# Patient Record
Sex: Male | Born: 1975 | Race: White | Hispanic: Yes | Marital: Married | Smoking: Current every day smoker
Health system: Southern US, Community
[De-identification: ages and names within clinical notes are randomized; demographics above are authoritative.]

---

## 2020-03-19 ENCOUNTER — Emergency Department (HOSPITAL_BASED_OUTPATIENT_CLINIC_OR_DEPARTMENT_OTHER): Payer: Self-pay

## 2020-03-19 ENCOUNTER — Encounter (HOSPITAL_BASED_OUTPATIENT_CLINIC_OR_DEPARTMENT_OTHER): Payer: Self-pay

## 2020-03-19 ENCOUNTER — Other Ambulatory Visit: Payer: Self-pay

## 2020-03-19 ENCOUNTER — Emergency Department (HOSPITAL_BASED_OUTPATIENT_CLINIC_OR_DEPARTMENT_OTHER)
Admission: EM | Admit: 2020-03-19 | Discharge: 2020-03-19 | Disposition: A | Discharge status: Home / Self Care | Payer: Self-pay | Attending: Emergency Medicine | Admitting: Emergency Medicine

## 2020-03-19 DIAGNOSIS — F17210 Nicotine dependence, cigarettes, uncomplicated: Secondary | ICD-10-CM | POA: Insufficient documentation

## 2020-03-19 DIAGNOSIS — W11XXXA Fall on and from ladder, initial encounter: Secondary | ICD-10-CM | POA: Insufficient documentation

## 2020-03-19 DIAGNOSIS — Y999 Unspecified external cause status: Secondary | ICD-10-CM | POA: Insufficient documentation

## 2020-03-19 DIAGNOSIS — Y929 Unspecified place or not applicable: Secondary | ICD-10-CM | POA: Insufficient documentation

## 2020-03-19 DIAGNOSIS — S82014A Nondisplaced osteochondral fracture of right patella, initial encounter for closed fracture: Secondary | ICD-10-CM | POA: Insufficient documentation

## 2020-03-19 DIAGNOSIS — S82001A Unspecified fracture of right patella, initial encounter for closed fracture: Secondary | ICD-10-CM

## 2020-03-19 DIAGNOSIS — Y939 Activity, unspecified: Secondary | ICD-10-CM | POA: Insufficient documentation

## 2020-03-19 DIAGNOSIS — W19XXXA Unspecified fall, initial encounter: Secondary | ICD-10-CM

## 2020-03-19 MED ORDER — OXYCODONE HCL 5 MG PO TABS: 5.0000 mg | ORAL_TABLET | ORAL | 0 refills | Status: AC | PRN

## 2020-03-19 MED ORDER — ACETAMINOPHEN 500 MG PO TABS
1000.0000 mg | ORAL_TABLET | Freq: Once | ORAL | Status: AC
  Administered 2020-03-19: 1000 mg via ORAL
  Filled 2020-03-19: qty 2

## 2020-03-19 MED ORDER — OXYCODONE HCL 5 MG PO TABS
5.0000 mg | ORAL_TABLET | Freq: Once | ORAL | Status: AC
  Administered 2020-03-19: 5 mg via ORAL
  Filled 2020-03-19: qty 1

## 2020-03-19 NOTE — ED Triage Notes (Signed)
Pt arrives with reports of fall from ladder, pain and swelling to both knees. Pt had surgery in 2018 on right knee, worse pain is on right knee. Pt reports the ladder was not sturdy and his knees hit the ladder ruts coming down.

## 2020-03-19 NOTE — Discharge Instructions (Signed)
Take Tylenol (acetaminophen) 1000 mg 4 times a day for 1 week. This is the maximum dose of Tylenol usually take from all sources. Please check other over-the-counter medications and prescriptions to ensure you are not taking other medications that contain acetaminophen.  You may also take ibuprofen 400 mg 6 times a day alternating with or at the same time as tylenol.  Take oxycodone as needed for breakthrough pain.  This medication can be addicting, sedating and cause constipation.

## 2020-03-19 NOTE — ED Provider Notes (Signed)
[facility] EMERGENCY DEPARTMENT Provider Note   CSN: [id number] Arrival date & time: 03/19/20  1022     History Chief Complaint  Patient presents with  . Fall    Omar Shields is a 44 y.o. male.  HPI      44yo male presents with concern for fall from ladder with knee pain.  Yesterday was on 66ft ladder and fell off, hitting knees on the rungs as he fell. No head trauma, headache, LOC, neck pain, numbness/weakness, chest, abdomen or pelvis pain. Reports mild right hand pain and right ankle pain in addition to bilateral knee pain left greater than right.  Last night was able to straighten right knee but unable to do that today. Reports UTD tetanus vax.  History reviewed. No pertinent past medical history.  There are no problems to display for this patient.   History reviewed. No pertinent surgical history.     No family history on file.  Social History   Tobacco Use  . Smoking status: Current Every Day Smoker    Packs/day: 1.00    Types: Cigarettes  . Smokeless tobacco: Never Used  Vaping Use  . Vaping Use: Never used  Substance Use Topics  . Alcohol use: Yes    Comment: occ  . Drug use: Never    Home Medications Prior to Admission medications   Medication Sig Start Date End Date Taking? Authorizing Provider  oxyCODONE (ROXICODONE) 5 MG immediate release tablet Take 1 tablet (5 mg total) by mouth every 4 (four) hours as needed for severe pain. 03/19/20   Alvira Monday, MD    Allergies    Patient has no known allergies.  Review of Systems   Review of Systems  Physical Exam Updated Vital Signs BP 125/72   Pulse 80   Temp 98.9 F (37.2 C)   Resp 18   Ht 5\' 4"  (1.626 m)   Wt 66.7 kg   SpO2 100%   BMI 25.23 kg/m   Physical Exam Vitals and nursing note reviewed.  Constitutional:      General: He is not in acute distress.    Appearance: He is well-developed. He is not diaphoretic.  HENT:     Head: Normocephalic and atraumatic.    Eyes:     Conjunctiva/sclera: Conjunctivae normal.  Cardiovascular:     Rate and Rhythm: Normal rate and regular rhythm.     Heart sounds: Normal heart sounds. No murmur heard.  No friction rub. No gallop.   Pulmonary:     Effort: Pulmonary effort is normal. No respiratory distress.     Breath sounds: Normal breath sounds. No wheezing or rales.  Abdominal:     General: There is no distension.     Palpations: Abdomen is soft.     Tenderness: There is no abdominal tenderness. There is no guarding.  Musculoskeletal:        General: Swelling and tenderness present.     Cervical back: Normal range of motion.     Comments: Bilateral knee swelling and tenderness, contusion left knee and abrasion Right knee with more signficant tenderness, difficulty extending knee   No C/T/L tenderness/pelvic tenderness Mild right 2nd MCP tenderness, mild ankle tenderness  Skin:    General: Skin is warm and dry.  Neurological:     Mental Status: He is alert and oriented to person, place, and time.     ED Results / Procedures / Treatments   Labs (all labs ordered are listed, but only abnormal results  are displayed) Labs Reviewed - No data to display  EKG None  Radiology DG Ankle Complete Right  Result Date: 03/19/2020 CLINICAL DATA:  Ankle pain since falling from a ladder. EXAM: RIGHT ANKLE - COMPLETE 3+ VIEW COMPARISON:  None. FINDINGS: The mineralization and alignment are normal. There is no evidence of acute fracture or dislocation. The joint spaces are preserved. No focal soft tissue swelling or foreign body. IMPRESSION: No evidence of acute right ankle injury. Electronically Signed   By: Carey Bullocks M.D.   On: 03/19/2020 15:59   DG Knee Complete 4 Views Left  Result Date: 03/19/2020 CLINICAL DATA:  Status post fall.  Knee pain. EXAM: LEFT KNEE - COMPLETE 4+ VIEW COMPARISON:  None. FINDINGS: No evidence of fracture, dislocation, or joint effusion. No evidence of arthropathy or other focal  bone abnormality. Soft tissues are unremarkable. IMPRESSION: Negative. Electronically Signed   By: Ted Mcalpine M.D.   On: 03/19/2020 11:57   DG Knee Complete 4 Views Right  Result Date: 03/19/2020 CLINICAL DATA:  Fall with swelling. EXAM: RIGHT KNEE - COMPLETE 4+ VIEW COMPARISON:  None. FINDINGS: Large suprapatellar joint effusion. Fracture of the patella, primarily identified superiorly and laterally. Not well delineated on the lateral view. IMPRESSION: Patella fracture or fractures with large suprapatellar joint effusion. Consider further evaluation with CT, to delineate the extent of fracture/fractures. Electronically Signed   By: Jeronimo Greaves M.D.   On: 03/19/2020 11:56   DG Hand Complete Right  Result Date: 03/19/2020 CLINICAL DATA:  Fall from a ladder.  Right hand soreness. EXAM: RIGHT HAND - COMPLETE 3+ VIEW COMPARISON:  None. FINDINGS: The mineralization and alignment are normal. There is no evidence of acute fracture or dislocation. The joint spaces are preserved. Mild soft tissue thickening is present around the 2nd and 3rd proximal interphalangeal joints. No apparent swelling, foreign body or erosive changes. IMPRESSION: No acute osseous findings. Electronically Signed   By: Carey Bullocks M.D.   On: 03/19/2020 15:57    Procedures Procedures (including critical care time)  Medications Ordered in ED Medications  oxyCODONE (Oxy IR/ROXICODONE) immediate release tablet 5 mg (5 mg Oral Given 03/19/20 1544)  acetaminophen (TYLENOL) tablet 1,000 mg (1,000 mg Oral Given 03/19/20 1544)    ED Course  I have reviewed the triage vital signs and the nursing notes.  Pertinent labs & imaging results that were available during my care of the patient were reviewed by me and considered in my medical decision making (see chart for details).    MDM Rules/Calculators/A&P                          44yo male presents with concern for fall from ladder yesterday with knee pain.  Low suspicion for  intracranial, spinal, thoracic/abdominal/pelvic injuries by history and exam.  NV intact.  XR right knee with patella fracture (or fractures-difficult to visualize)--unable to fully extend at knee.  Left knee without abnormalities on XR.  Discussed with Dr. Aundria Rud. Recommend WBAT with knee immobilizer and follow up in one week. Reviewed in [place] drug database and discussed risks of narcotics. Patient discharged in stable condition with understanding of reasons to return.       Final Clinical Impression(s) / ED Diagnoses Final diagnoses:  Fall, initial encounter  Closed nondisplaced fracture of right patella, unspecified fracture morphology, initial encounter    Rx / DC Orders ED Discharge Orders         Ordered  oxyCODONE (ROXICODONE) 5 MG immediate release tablet  Every 4 hours PRN        03/19/20 1552           Alvira Monday, MD 03/19/20 2311

## 2021-01-09 IMAGING — DX DG ANKLE COMPLETE 3+V*R*
3 series · 3 of 3 positions shown · non-contrast
Comparison: None.

CLINICAL DATA: Ankle pain since falling from a ladder.

EXAM:
RIGHT ANKLE - COMPLETE 3+ VIEW

[ankle ap]
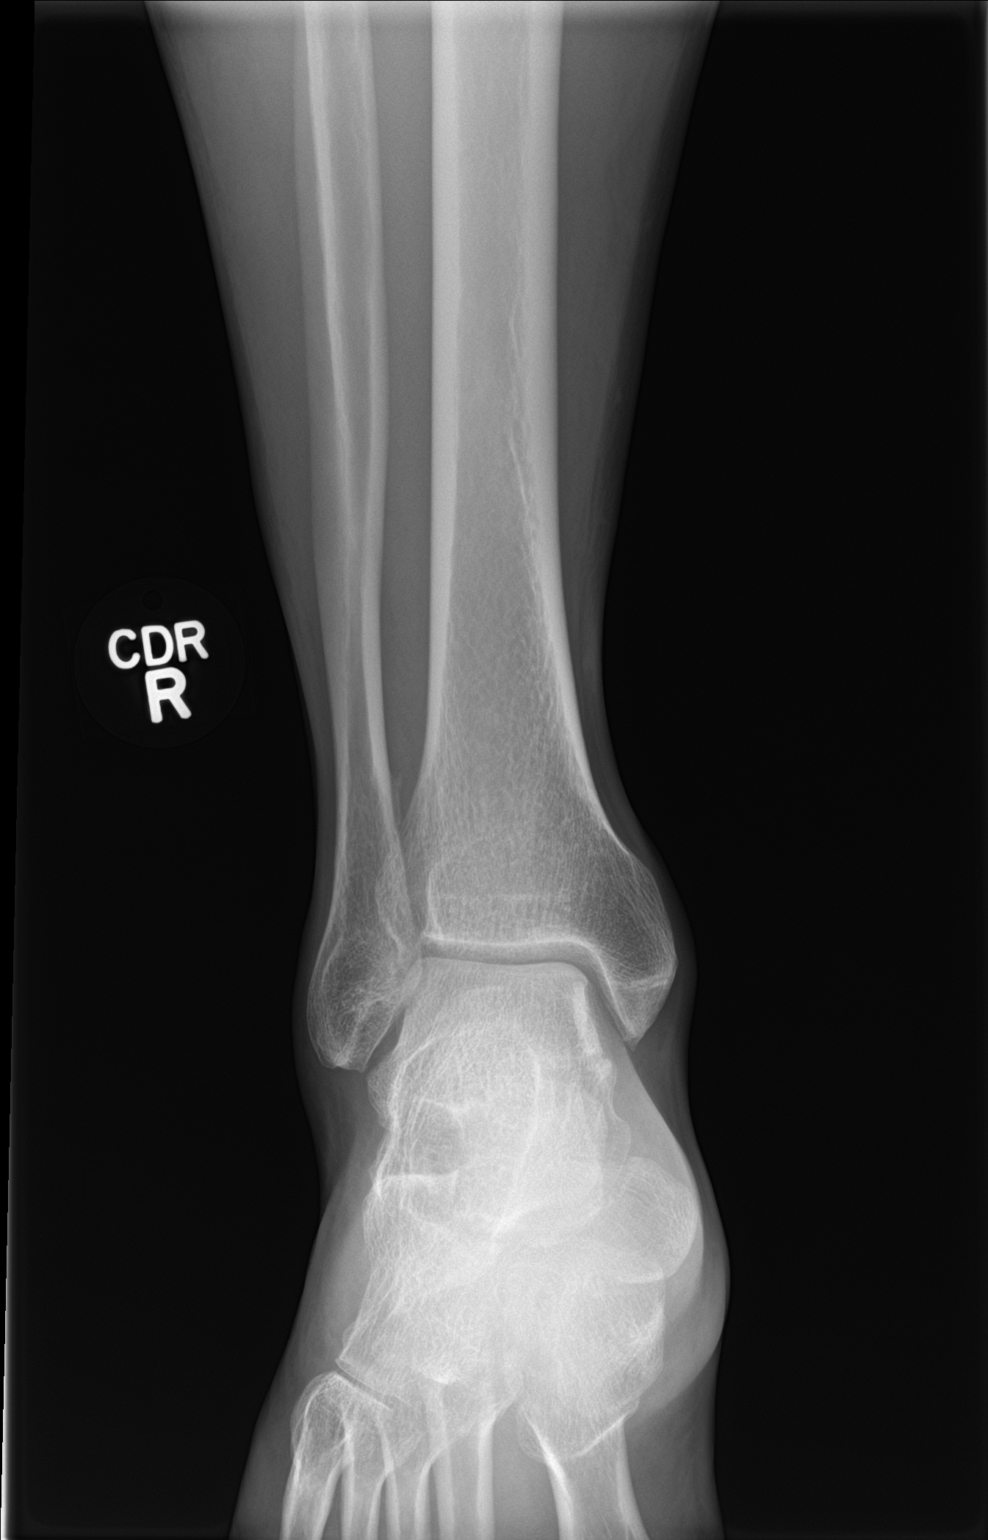

[ankle obl]
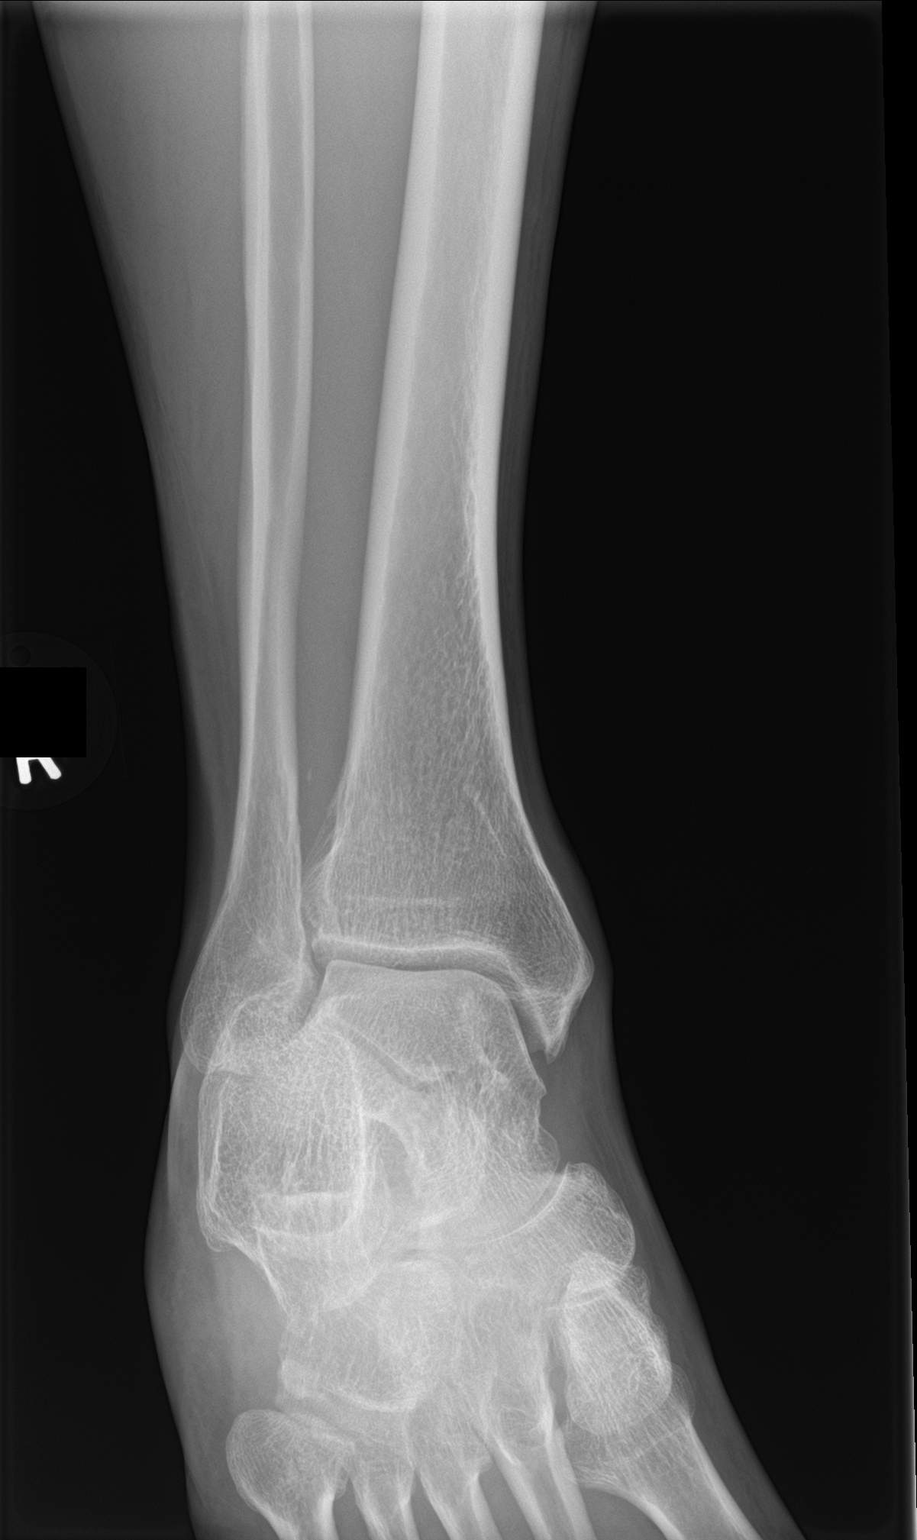

[ankle lat]
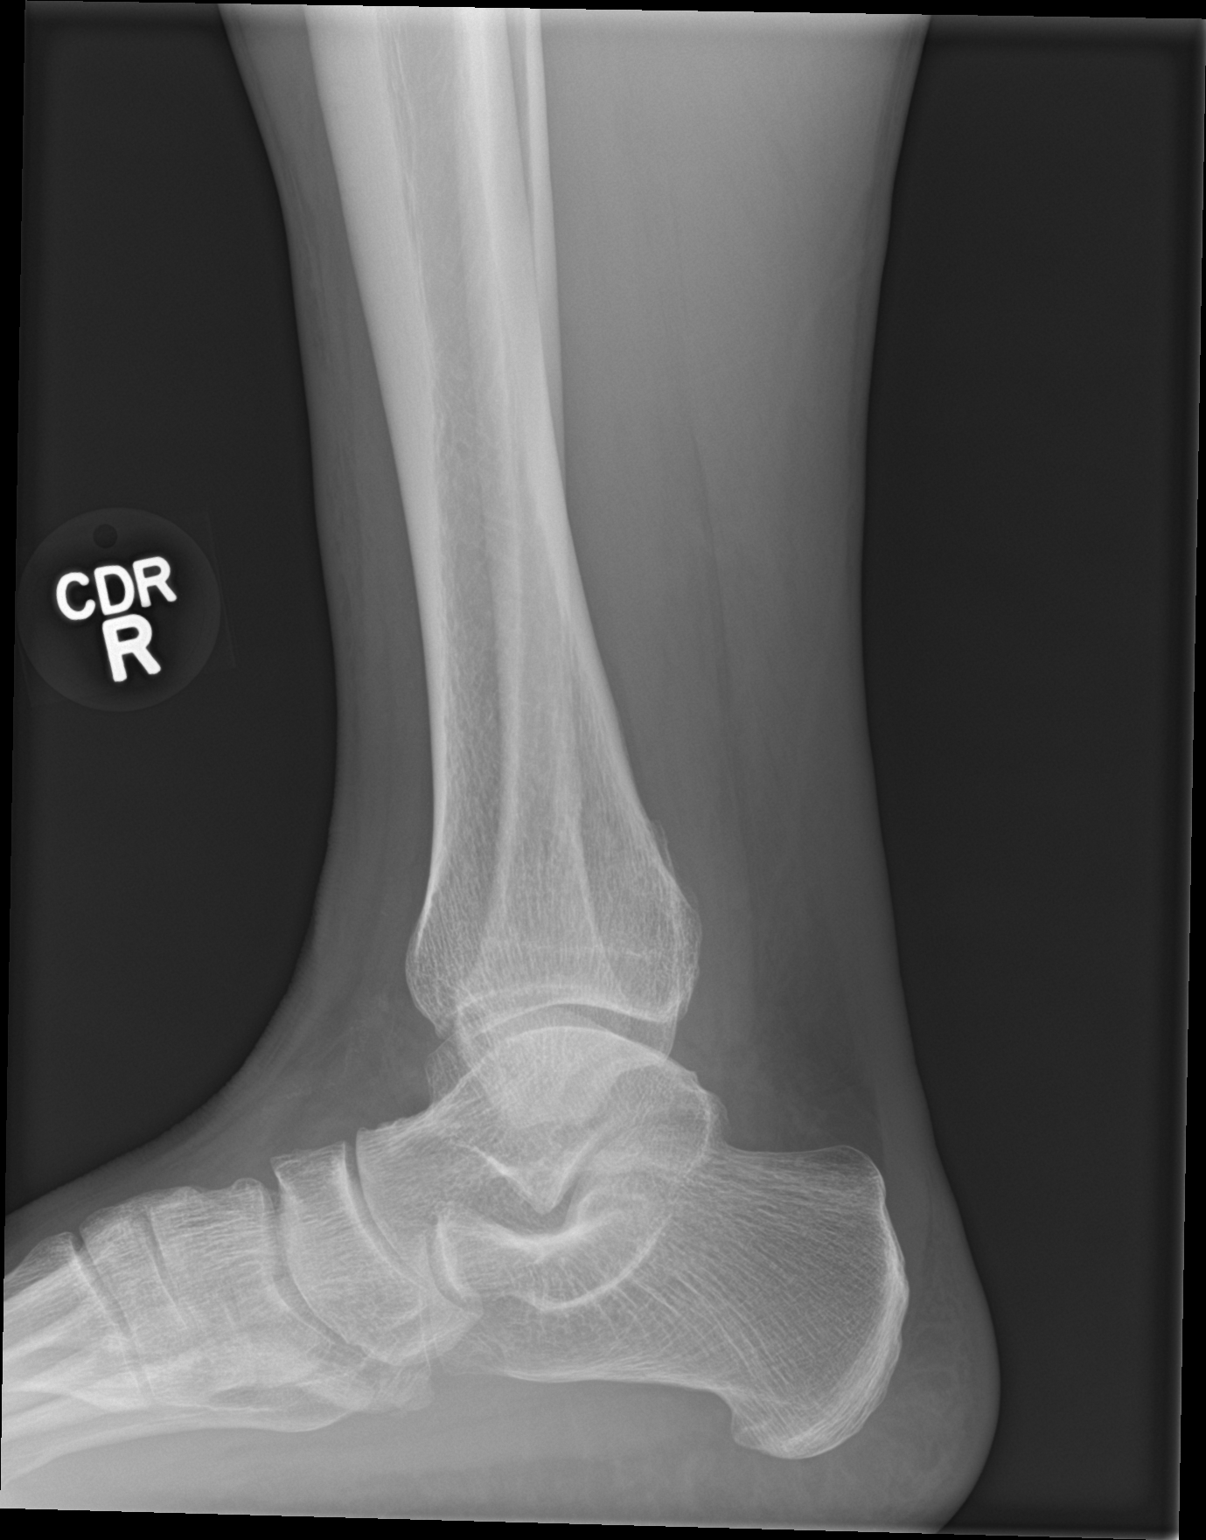

[3 of 3 positions shown; findings below may reference images not displayed]

FINDINGS: The mineralization and alignment are normal. There is no evidence of
acute fracture or dislocation. The joint spaces are preserved. No
focal soft tissue swelling or foreign body.
IMPRESSION: No evidence of acute right ankle injury.

## 2021-01-09 IMAGING — CR DG KNEE COMPLETE 4+V*L*
4 series · 4 of 4 positions shown · non-contrast
Comparison: None.

CLINICAL DATA: Status post fall.  Knee pain.

EXAM:
LEFT KNEE - COMPLETE 4+ VIEW

[t knee ap left]
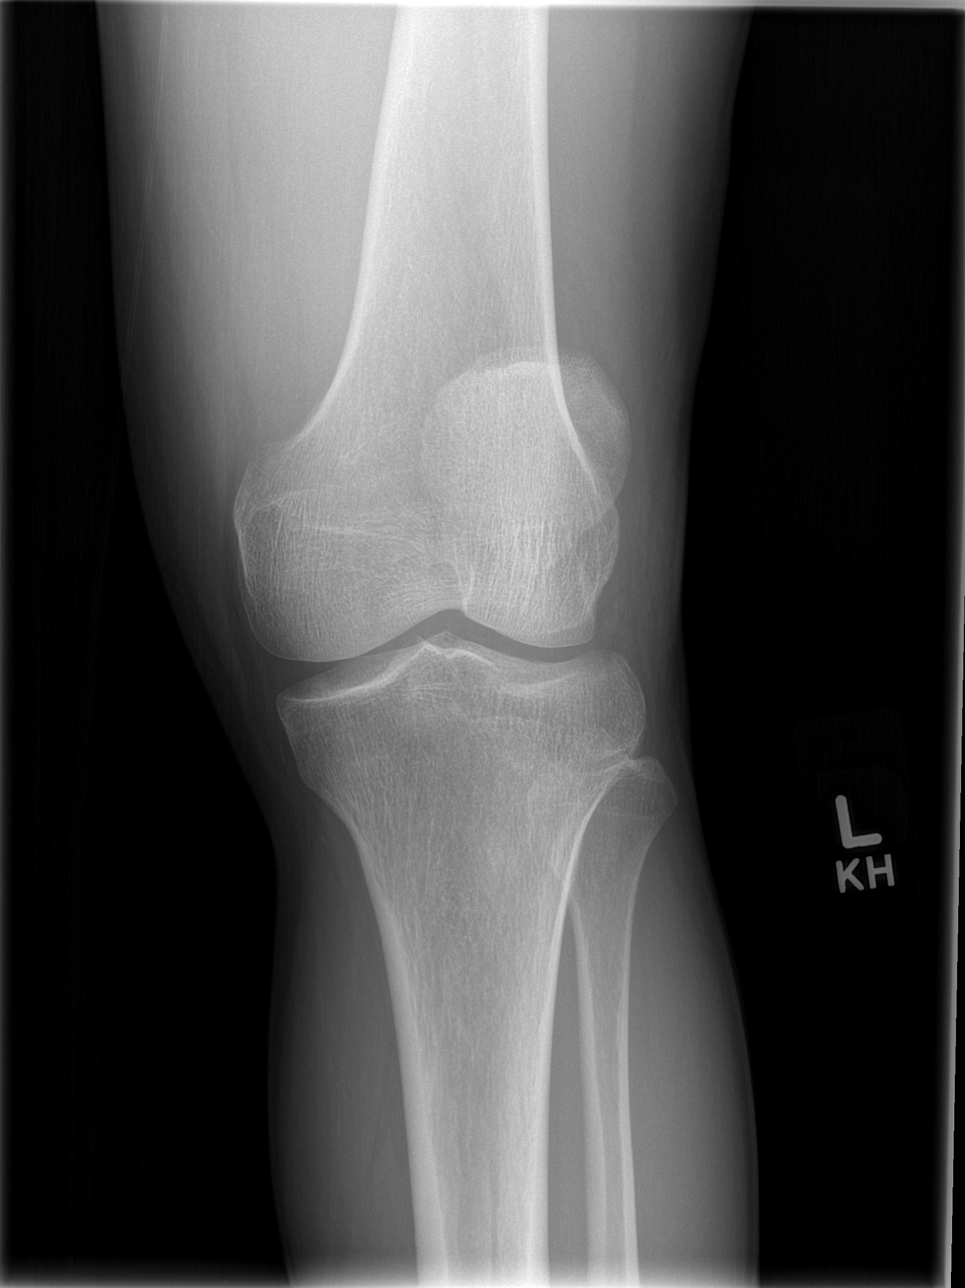

[t knee oblique left (1 of 2)]
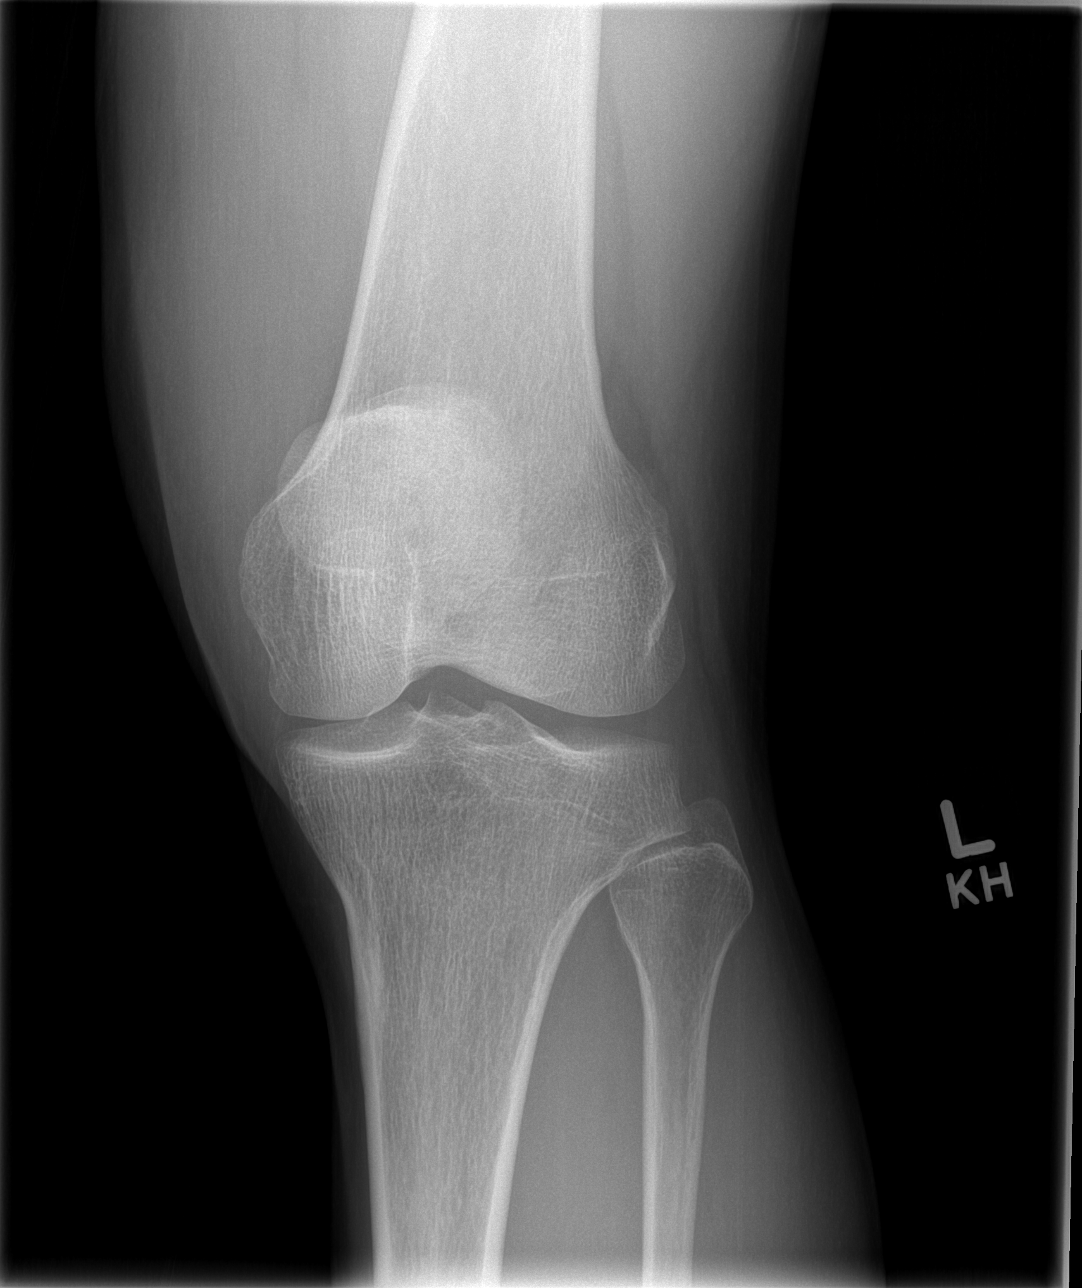

[t knee oblique left (2 of 2)]
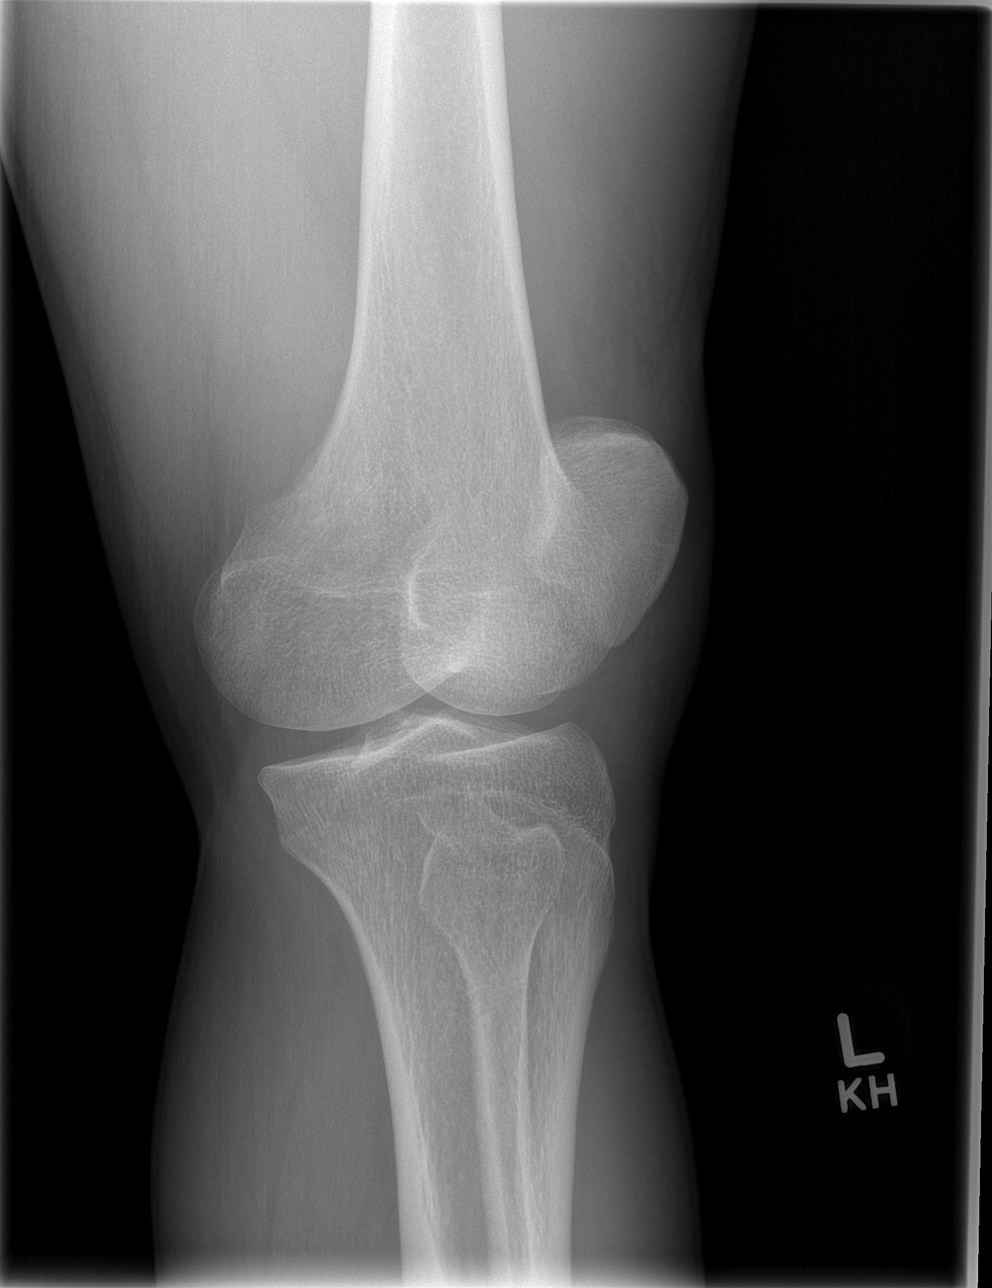

[t knee lat left]
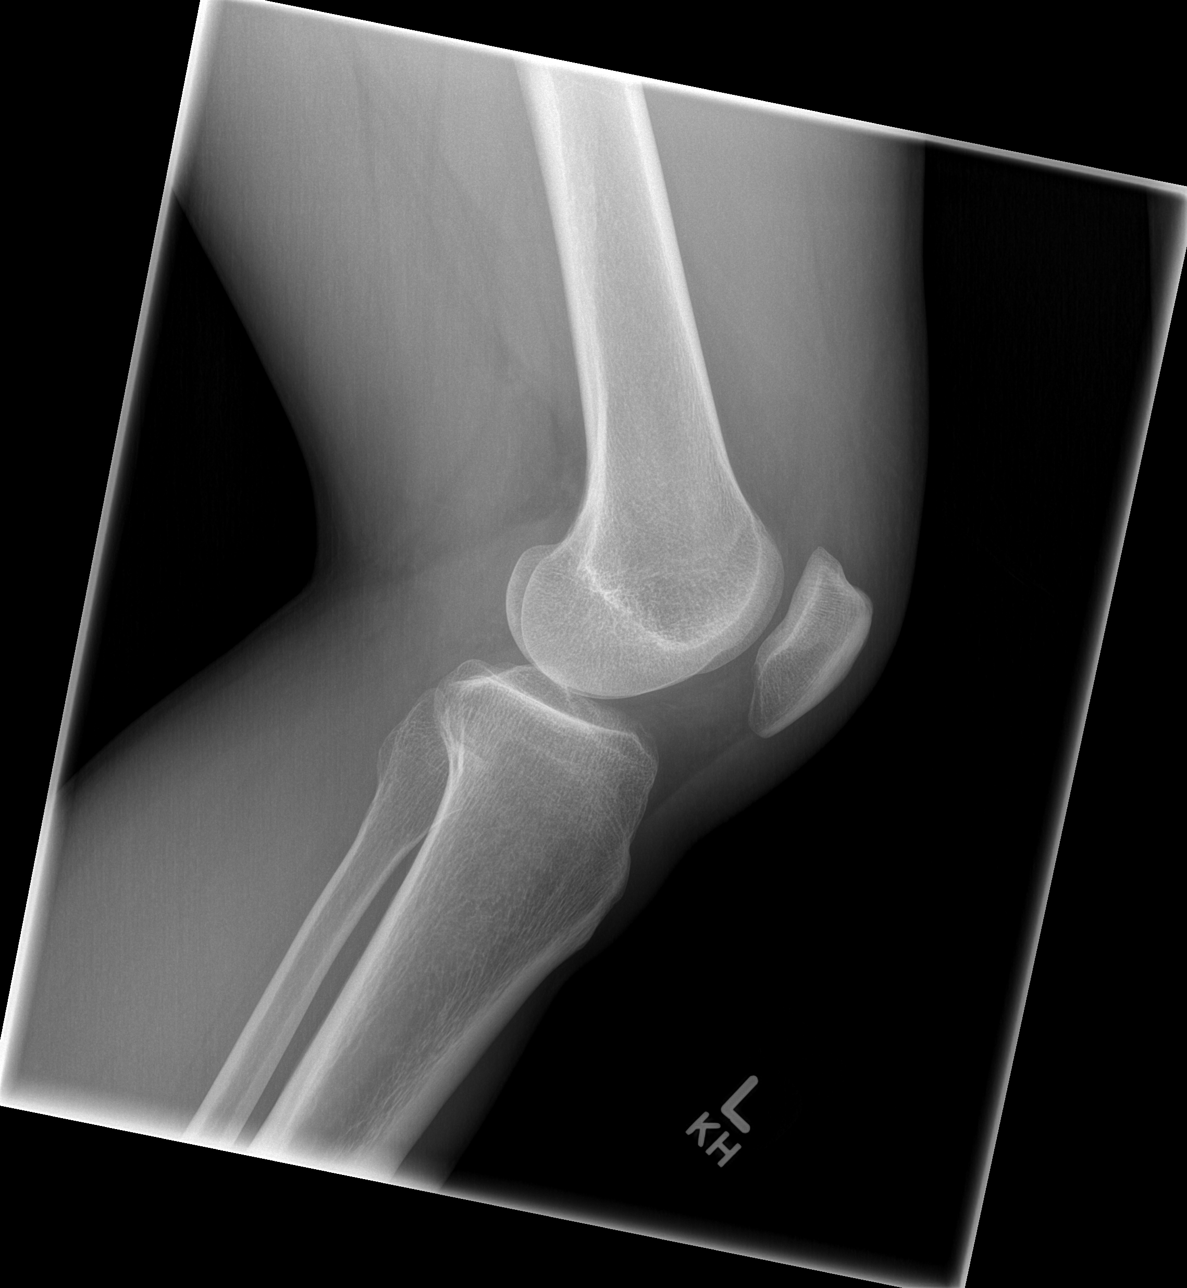

[4 of 4 positions shown; findings below may reference images not displayed]

FINDINGS: No evidence of fracture, dislocation, or joint effusion. No evidence
of arthropathy or other focal bone abnormality. Soft tissues are
unremarkable.
IMPRESSION: Negative.
